# Patient Record
Sex: Female | Born: 1975
Health system: Southern US, Community
[De-identification: ages and names within clinical notes are randomized; demographics above are authoritative.]

---

## 1998-12-18 ENCOUNTER — Other Ambulatory Visit: Admission: RE | Admit: 1998-12-18 | Discharge: 1998-12-18 | Payer: Self-pay | Admitting: Family Medicine

## 1999-02-28 ENCOUNTER — Emergency Department (HOSPITAL_COMMUNITY): Admission: EM | Admit: 1999-02-28 | Discharge: 1999-02-28 | Payer: Self-pay | Admitting: Emergency Medicine

## 2000-06-09 ENCOUNTER — Other Ambulatory Visit: Admission: RE | Admit: 2000-06-09 | Discharge: 2000-06-09 | Payer: Self-pay | Admitting: Obstetrics & Gynecology

## 2001-06-02 ENCOUNTER — Other Ambulatory Visit: Admission: RE | Admit: 2001-06-02 | Discharge: 2001-06-02 | Payer: Self-pay | Admitting: Obstetrics and Gynecology

## 2002-06-19 ENCOUNTER — Inpatient Hospital Stay (HOSPITAL_COMMUNITY): Admission: AD | Admit: 2002-06-19 | Discharge: 2002-06-21 | Payer: Self-pay | Admitting: Obstetrics and Gynecology

## 2002-08-04 ENCOUNTER — Other Ambulatory Visit: Admission: RE | Admit: 2002-08-04 | Discharge: 2002-08-04 | Payer: Self-pay | Admitting: Obstetrics and Gynecology

## 2002-11-16 ENCOUNTER — Emergency Department (HOSPITAL_COMMUNITY): Admission: EM | Admit: 2002-11-16 | Discharge: 2002-11-16 | Payer: Self-pay | Admitting: Emergency Medicine

## 2003-08-09 ENCOUNTER — Other Ambulatory Visit: Admission: RE | Admit: 2003-08-09 | Discharge: 2003-08-09 | Payer: Self-pay | Admitting: Obstetrics and Gynecology

## 2004-05-02 ENCOUNTER — Emergency Department (HOSPITAL_COMMUNITY): Admission: EM | Admit: 2004-05-02 | Discharge: 2004-05-02 | Payer: Self-pay | Admitting: Emergency Medicine

## 2004-07-26 ENCOUNTER — Other Ambulatory Visit: Admission: RE | Admit: 2004-07-26 | Discharge: 2004-07-26 | Payer: Self-pay | Admitting: Obstetrics and Gynecology

## 2005-10-07 ENCOUNTER — Other Ambulatory Visit: Admission: RE | Admit: 2005-10-07 | Discharge: 2005-10-07 | Payer: Self-pay | Admitting: Obstetrics and Gynecology

## 2005-10-22 ENCOUNTER — Encounter: Admission: RE | Admit: 2005-10-22 | Discharge: 2005-10-22 | Payer: Self-pay | Admitting: Obstetrics and Gynecology

## 2015-03-21 ENCOUNTER — Other Ambulatory Visit: Payer: Self-pay

## 2015-03-21 DIAGNOSIS — Z1231 Encounter for screening mammogram for malignant neoplasm of breast: Secondary | ICD-10-CM

## 2015-04-03 ENCOUNTER — Ambulatory Visit
Admission: RE | Admit: 2015-04-03 | Discharge: 2015-04-03 | Disposition: A | Discharge status: Home / Self Care | Payer: BLUE CROSS/BLUE SHIELD | Source: Ambulatory Visit

## 2015-04-03 DIAGNOSIS — Z1231 Encounter for screening mammogram for malignant neoplasm of breast: Secondary | ICD-10-CM

## 2015-11-22 ENCOUNTER — Other Ambulatory Visit: Payer: Self-pay | Admitting: Orthopaedic Surgery

## 2015-11-22 DIAGNOSIS — M79651 Pain in right thigh: Secondary | ICD-10-CM

## 2015-12-02 ENCOUNTER — Ambulatory Visit
Admission: RE | Admit: 2015-12-02 | Discharge: 2015-12-02 | Disposition: A | Discharge status: Home / Self Care | Payer: BLUE CROSS/BLUE SHIELD | Source: Ambulatory Visit | Attending: Orthopaedic Surgery | Admitting: Orthopaedic Surgery

## 2015-12-02 DIAGNOSIS — M79651 Pain in right thigh: Secondary | ICD-10-CM

## 2016-01-04 DIAGNOSIS — F341 Dysthymic disorder: Secondary | ICD-10-CM | POA: Diagnosis not present

## 2016-01-13 DIAGNOSIS — F341 Dysthymic disorder: Secondary | ICD-10-CM | POA: Diagnosis not present

## 2016-01-31 DIAGNOSIS — F341 Dysthymic disorder: Secondary | ICD-10-CM | POA: Diagnosis not present

## 2016-02-02 DIAGNOSIS — Z713 Dietary counseling and surveillance: Secondary | ICD-10-CM | POA: Diagnosis not present

## 2016-02-22 DIAGNOSIS — F341 Dysthymic disorder: Secondary | ICD-10-CM | POA: Diagnosis not present

## 2016-03-14 DIAGNOSIS — F341 Dysthymic disorder: Secondary | ICD-10-CM | POA: Diagnosis not present

## 2016-04-09 DIAGNOSIS — Z01419 Encounter for gynecological examination (general) (routine) without abnormal findings: Secondary | ICD-10-CM | POA: Diagnosis not present

## 2016-04-10 DIAGNOSIS — Z713 Dietary counseling and surveillance: Secondary | ICD-10-CM | POA: Diagnosis not present

## 2016-04-18 ENCOUNTER — Other Ambulatory Visit: Payer: Self-pay | Admitting: Obstetrics and Gynecology

## 2016-04-18 DIAGNOSIS — Z1231 Encounter for screening mammogram for malignant neoplasm of breast: Secondary | ICD-10-CM

## 2016-04-22 DIAGNOSIS — F341 Dysthymic disorder: Secondary | ICD-10-CM | POA: Diagnosis not present

## 2016-05-06 ENCOUNTER — Ambulatory Visit
Admission: RE | Admit: 2016-05-06 | Discharge: 2016-05-06 | Disposition: A | Discharge status: Home / Self Care | Payer: BLUE CROSS/BLUE SHIELD | Source: Ambulatory Visit | Attending: Obstetrics and Gynecology | Admitting: Obstetrics and Gynecology

## 2016-05-06 DIAGNOSIS — Z1231 Encounter for screening mammogram for malignant neoplasm of breast: Secondary | ICD-10-CM | POA: Diagnosis not present

## 2016-05-17 DIAGNOSIS — E538 Deficiency of other specified B group vitamins: Secondary | ICD-10-CM | POA: Diagnosis not present

## 2016-05-28 DIAGNOSIS — L738 Other specified follicular disorders: Secondary | ICD-10-CM | POA: Diagnosis not present

## 2016-05-28 DIAGNOSIS — D2239 Melanocytic nevi of other parts of face: Secondary | ICD-10-CM | POA: Diagnosis not present

## 2016-05-28 DIAGNOSIS — L720 Epidermal cyst: Secondary | ICD-10-CM | POA: Diagnosis not present

## 2016-05-31 DIAGNOSIS — E538 Deficiency of other specified B group vitamins: Secondary | ICD-10-CM | POA: Diagnosis not present

## 2016-06-06 DIAGNOSIS — F341 Dysthymic disorder: Secondary | ICD-10-CM | POA: Diagnosis not present

## 2016-06-19 DIAGNOSIS — Z713 Dietary counseling and surveillance: Secondary | ICD-10-CM | POA: Diagnosis not present

## 2016-06-28 DIAGNOSIS — E538 Deficiency of other specified B group vitamins: Secondary | ICD-10-CM | POA: Diagnosis not present

## 2016-07-22 DIAGNOSIS — F341 Dysthymic disorder: Secondary | ICD-10-CM | POA: Diagnosis not present

## 2016-07-26 DIAGNOSIS — E538 Deficiency of other specified B group vitamins: Secondary | ICD-10-CM | POA: Diagnosis not present

## 2016-07-31 ENCOUNTER — Ambulatory Visit (INDEPENDENT_AMBULATORY_CARE_PROVIDER_SITE_OTHER): Payer: Self-pay | Admitting: Orthopaedic Surgery

## 2016-08-13 DIAGNOSIS — Z713 Dietary counseling and surveillance: Secondary | ICD-10-CM | POA: Diagnosis not present

## 2016-08-23 DIAGNOSIS — Z23 Encounter for immunization: Secondary | ICD-10-CM | POA: Diagnosis not present

## 2016-08-23 DIAGNOSIS — E538 Deficiency of other specified B group vitamins: Secondary | ICD-10-CM | POA: Diagnosis not present

## 2016-09-19 DIAGNOSIS — E538 Deficiency of other specified B group vitamins: Secondary | ICD-10-CM | POA: Diagnosis not present

## 2016-10-22 DIAGNOSIS — Z713 Dietary counseling and surveillance: Secondary | ICD-10-CM | POA: Diagnosis not present

## 2016-11-13 DIAGNOSIS — F3342 Major depressive disorder, recurrent, in full remission: Secondary | ICD-10-CM | POA: Diagnosis not present

## 2016-11-13 DIAGNOSIS — E538 Deficiency of other specified B group vitamins: Secondary | ICD-10-CM | POA: Diagnosis not present

## 2016-12-19 DIAGNOSIS — Z713 Dietary counseling and surveillance: Secondary | ICD-10-CM | POA: Diagnosis not present

## 2017-01-11 IMAGING — MG 2D DIGITAL SCREENING BILATERAL MAMMOGRAM WITH CAD AND ADJUNCT TO
9 of 12 series · 9 of 28 positions shown · non-contrast
Comparison: Previous exam(s).

CLINICAL DATA: Screening.

EXAM:
2D DIGITAL SCREENING BILATERAL MAMMOGRAM WITH CAD AND ADJUNCT TOMO

[R MLO]
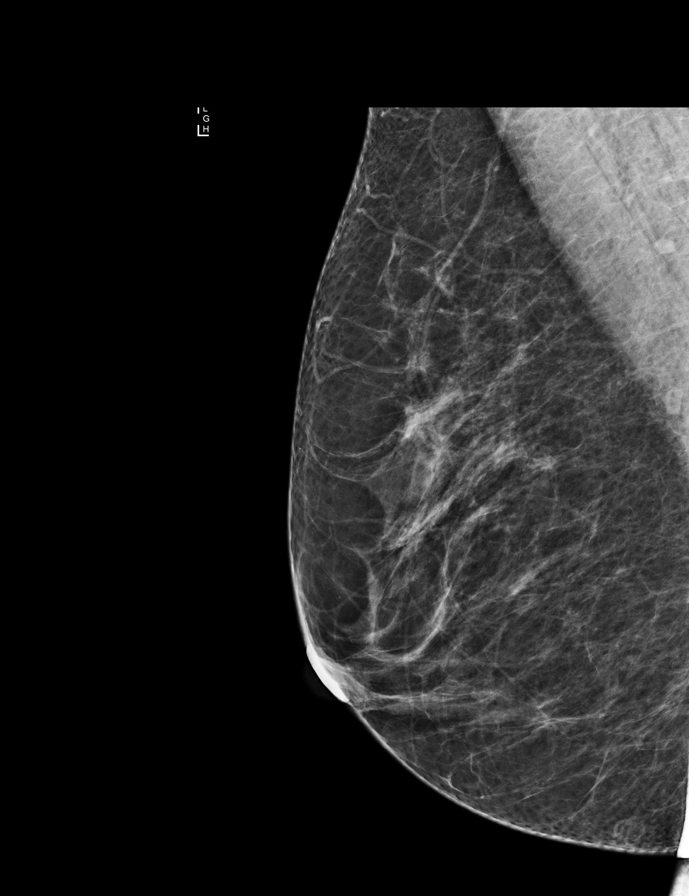

[L CC]
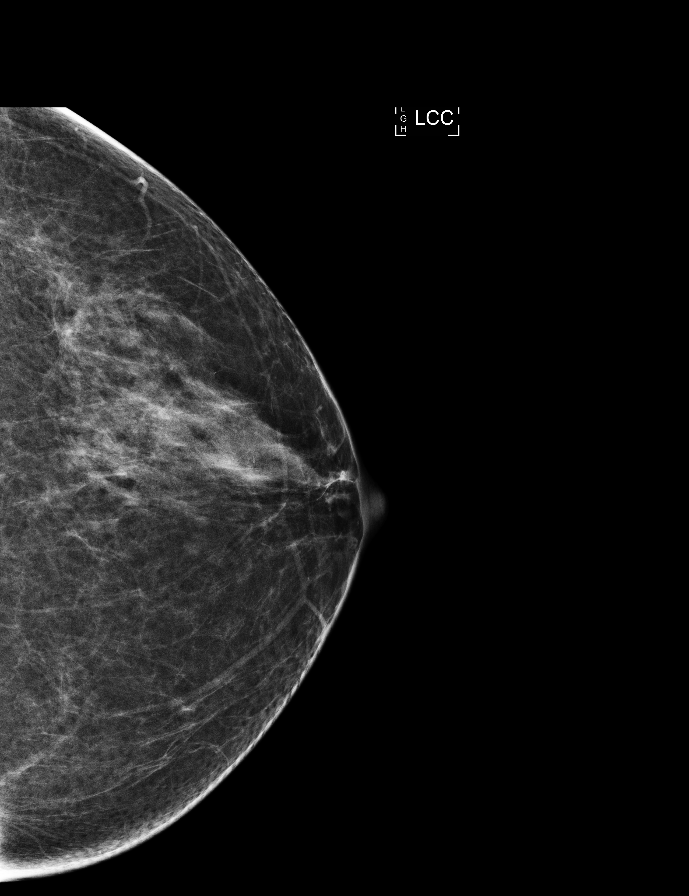

[L MLO]
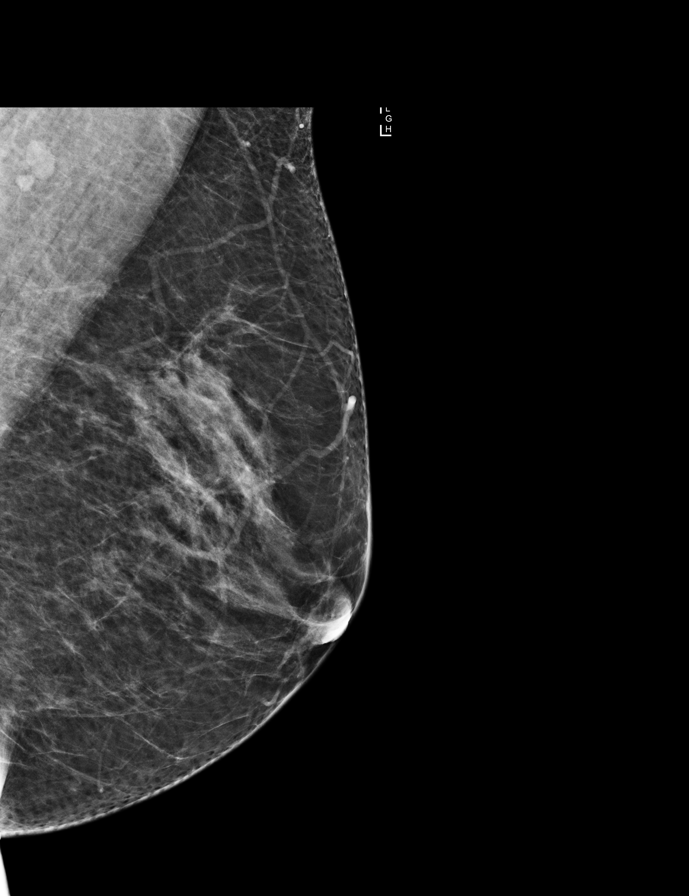

[R CC synth-2D]
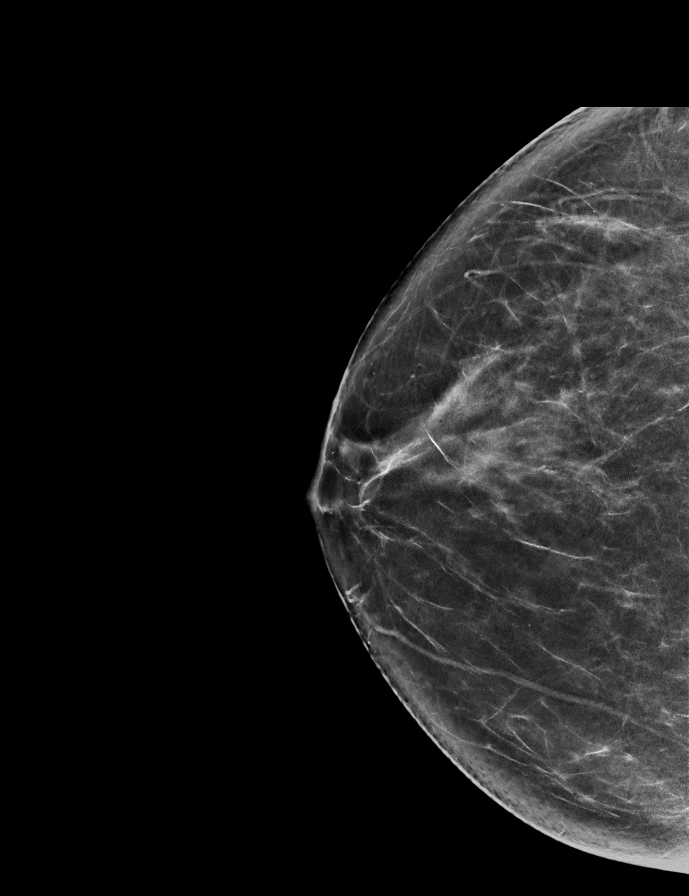

[L CC synth-2D]
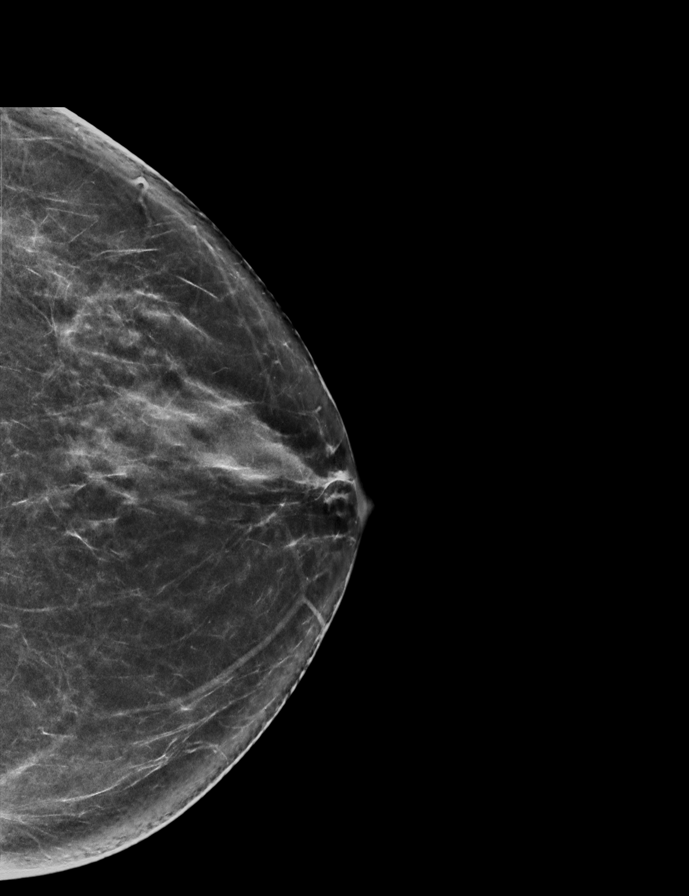

[R MLO synth-2D]
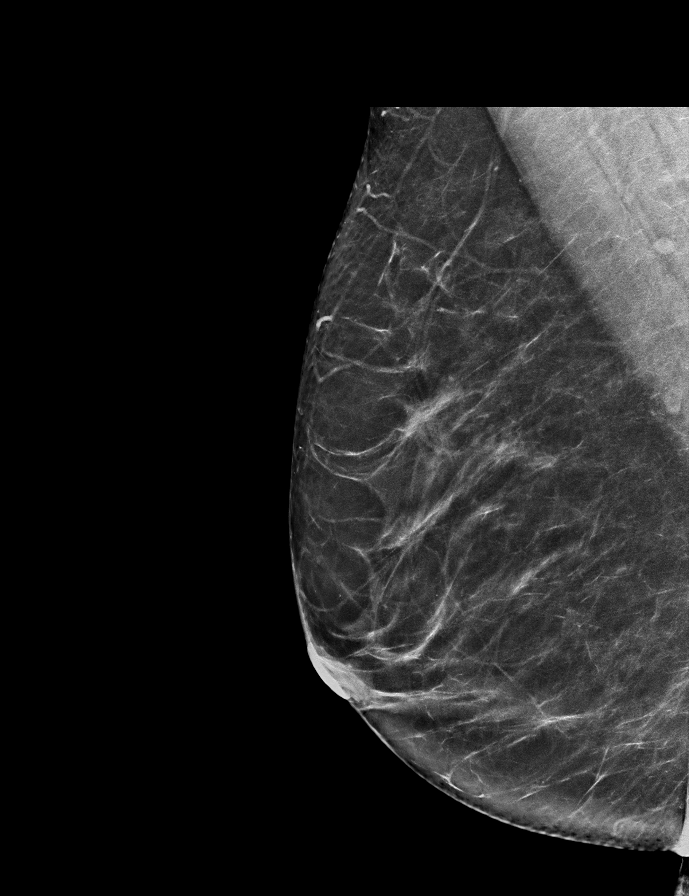

[L MLO synth-2D]
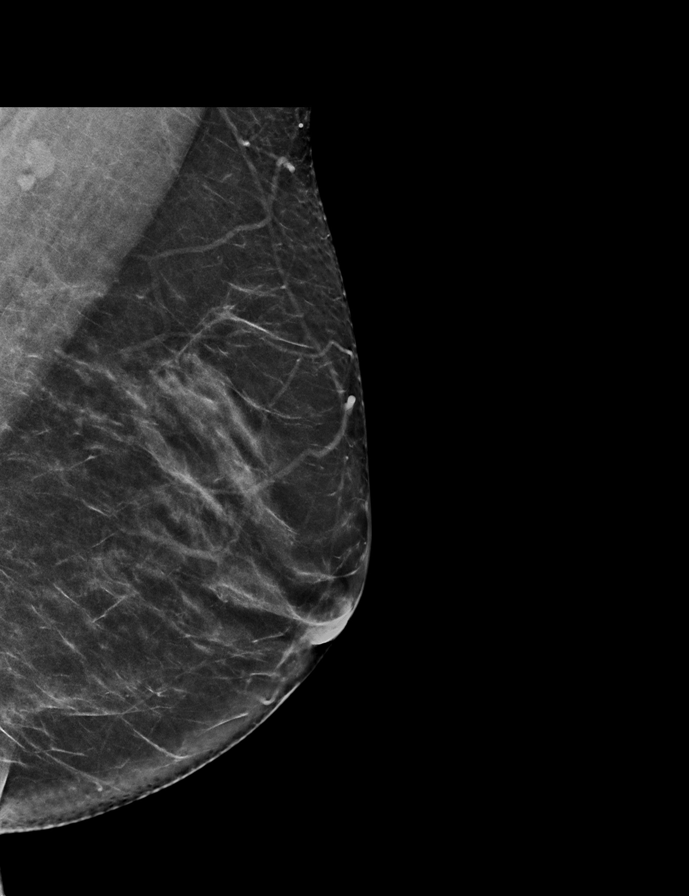

[R CC]
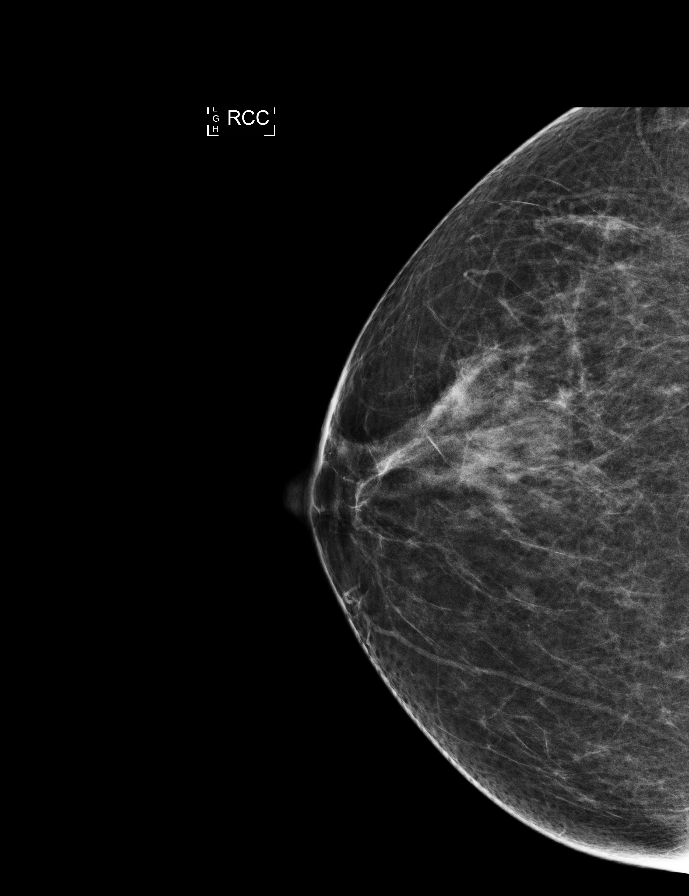

[R CC tomo · tomo slice 37/72.0]
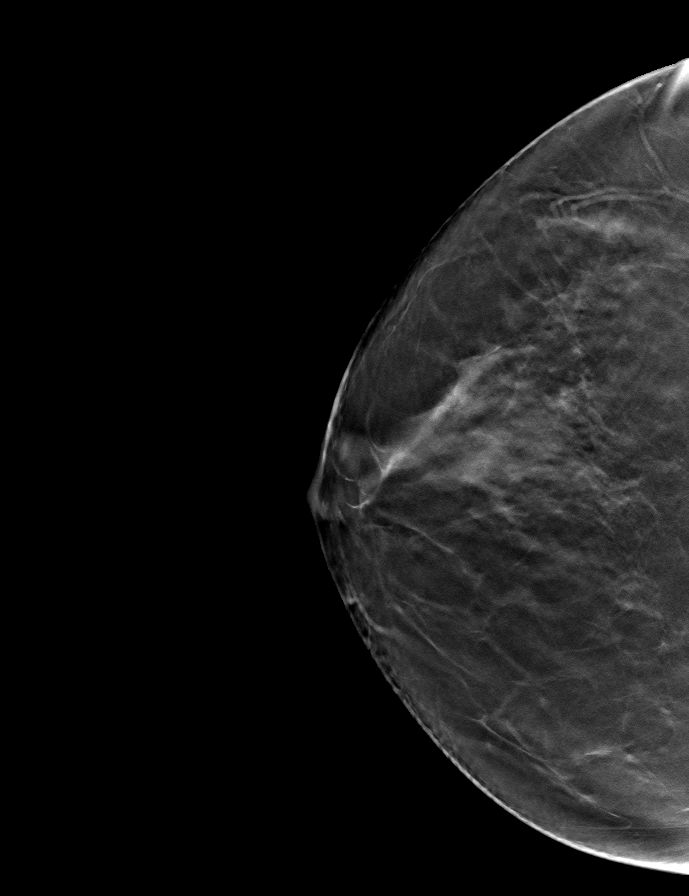

[9 of 28 positions shown; findings below may reference images not displayed]

ACR Breast Density Category b: There are scattered areas of
fibroglandular density.
FINDINGS: There are no findings suspicious for malignancy. Images were
processed with CAD.
IMPRESSION: No mammographic evidence of malignancy. A result letter of this
screening mammogram will be mailed directly to the patient.

RECOMMENDATION:
Screening mammogram in one year. (Code:97-6-RS4)

BI-RADS CATEGORY  1: Negative.

## 2017-02-20 DIAGNOSIS — Z713 Dietary counseling and surveillance: Secondary | ICD-10-CM | POA: Diagnosis not present

## 2017-04-02 DIAGNOSIS — D2272 Melanocytic nevi of left lower limb, including hip: Secondary | ICD-10-CM | POA: Diagnosis not present

## 2017-04-02 DIAGNOSIS — D225 Melanocytic nevi of trunk: Secondary | ICD-10-CM | POA: Diagnosis not present

## 2017-04-02 DIAGNOSIS — D2230 Melanocytic nevi of unspecified part of face: Secondary | ICD-10-CM | POA: Diagnosis not present

## 2017-04-09 ENCOUNTER — Other Ambulatory Visit: Payer: Self-pay | Admitting: Obstetrics and Gynecology

## 2017-04-09 DIAGNOSIS — Z1231 Encounter for screening mammogram for malignant neoplasm of breast: Secondary | ICD-10-CM

## 2017-04-10 DIAGNOSIS — Z01419 Encounter for gynecological examination (general) (routine) without abnormal findings: Secondary | ICD-10-CM | POA: Diagnosis not present

## 2017-04-10 DIAGNOSIS — N898 Other specified noninflammatory disorders of vagina: Secondary | ICD-10-CM | POA: Diagnosis not present

## 2017-04-24 DIAGNOSIS — Z713 Dietary counseling and surveillance: Secondary | ICD-10-CM | POA: Diagnosis not present

## 2017-05-13 ENCOUNTER — Ambulatory Visit
Admission: RE | Admit: 2017-05-13 | Discharge: 2017-05-13 | Disposition: A | Discharge status: Home / Self Care | Payer: BLUE CROSS/BLUE SHIELD | Source: Ambulatory Visit | Attending: Obstetrics and Gynecology | Admitting: Obstetrics and Gynecology

## 2017-05-13 DIAGNOSIS — Z1231 Encounter for screening mammogram for malignant neoplasm of breast: Secondary | ICD-10-CM

## 2017-05-20 DIAGNOSIS — F3342 Major depressive disorder, recurrent, in full remission: Secondary | ICD-10-CM | POA: Diagnosis not present

## 2017-07-02 DIAGNOSIS — Z713 Dietary counseling and surveillance: Secondary | ICD-10-CM | POA: Diagnosis not present

## 2017-07-22 DIAGNOSIS — Z23 Encounter for immunization: Secondary | ICD-10-CM | POA: Diagnosis not present

## 2017-08-26 DIAGNOSIS — Z713 Dietary counseling and surveillance: Secondary | ICD-10-CM | POA: Diagnosis not present

## 2017-09-02 DIAGNOSIS — D2239 Melanocytic nevi of other parts of face: Secondary | ICD-10-CM | POA: Diagnosis not present

## 2017-09-02 DIAGNOSIS — L720 Epidermal cyst: Secondary | ICD-10-CM | POA: Diagnosis not present

## 2017-09-02 DIAGNOSIS — L700 Acne vulgaris: Secondary | ICD-10-CM | POA: Diagnosis not present

## 2017-10-30 DIAGNOSIS — Z713 Dietary counseling and surveillance: Secondary | ICD-10-CM | POA: Diagnosis not present

## 2017-11-19 DIAGNOSIS — Z13220 Encounter for screening for lipoid disorders: Secondary | ICD-10-CM | POA: Diagnosis not present

## 2017-11-19 DIAGNOSIS — Z0000 Encounter for general adult medical examination without abnormal findings: Secondary | ICD-10-CM | POA: Diagnosis not present

## 2017-11-19 DIAGNOSIS — E538 Deficiency of other specified B group vitamins: Secondary | ICD-10-CM | POA: Diagnosis not present

## 2017-11-19 DIAGNOSIS — F3342 Major depressive disorder, recurrent, in full remission: Secondary | ICD-10-CM | POA: Diagnosis not present

## 2017-12-24 DIAGNOSIS — J019 Acute sinusitis, unspecified: Secondary | ICD-10-CM | POA: Diagnosis not present

## 2017-12-25 DIAGNOSIS — Z713 Dietary counseling and surveillance: Secondary | ICD-10-CM | POA: Diagnosis not present

## 2018-01-05 DIAGNOSIS — J069 Acute upper respiratory infection, unspecified: Secondary | ICD-10-CM | POA: Diagnosis not present

## 2018-03-05 DIAGNOSIS — Z713 Dietary counseling and surveillance: Secondary | ICD-10-CM | POA: Diagnosis not present

## 2018-06-08 ENCOUNTER — Other Ambulatory Visit (HOSPITAL_COMMUNITY)
Admission: RE | Admit: 2018-06-08 | Discharge: 2018-06-08 | Disposition: A | Discharge status: Home / Self Care | Payer: Managed Care, Other (non HMO) | Source: Ambulatory Visit | Attending: Obstetrics and Gynecology | Admitting: Obstetrics and Gynecology

## 2018-06-08 ENCOUNTER — Other Ambulatory Visit: Payer: Self-pay | Admitting: Obstetrics and Gynecology

## 2018-06-08 DIAGNOSIS — Z01411 Encounter for gynecological examination (general) (routine) with abnormal findings: Secondary | ICD-10-CM | POA: Insufficient documentation

## 2018-06-10 LAB — CYTOLOGY - PAP
Diagnosis: NEGATIVE
HPV: NOT DETECTED

## 2018-06-29 ENCOUNTER — Other Ambulatory Visit: Payer: Self-pay | Admitting: Obstetrics and Gynecology

## 2018-06-29 DIAGNOSIS — Z1231 Encounter for screening mammogram for malignant neoplasm of breast: Secondary | ICD-10-CM

## 2018-08-04 ENCOUNTER — Ambulatory Visit
Admission: RE | Admit: 2018-08-04 | Discharge: 2018-08-04 | Disposition: A | Discharge status: Home / Self Care | Payer: Managed Care, Other (non HMO) | Source: Ambulatory Visit | Attending: Obstetrics and Gynecology | Admitting: Obstetrics and Gynecology

## 2018-08-04 DIAGNOSIS — Z1231 Encounter for screening mammogram for malignant neoplasm of breast: Secondary | ICD-10-CM

## 2018-11-24 DIAGNOSIS — E538 Deficiency of other specified B group vitamins: Secondary | ICD-10-CM | POA: Diagnosis not present

## 2018-11-24 DIAGNOSIS — Z0000 Encounter for general adult medical examination without abnormal findings: Secondary | ICD-10-CM | POA: Diagnosis not present

## 2018-11-24 DIAGNOSIS — Z131 Encounter for screening for diabetes mellitus: Secondary | ICD-10-CM | POA: Diagnosis not present

## 2018-11-24 DIAGNOSIS — E7800 Pure hypercholesterolemia, unspecified: Secondary | ICD-10-CM | POA: Diagnosis not present

## 2019-02-03 DIAGNOSIS — F3342 Major depressive disorder, recurrent, in full remission: Secondary | ICD-10-CM | POA: Diagnosis not present

## 2019-02-03 DIAGNOSIS — F419 Anxiety disorder, unspecified: Secondary | ICD-10-CM | POA: Diagnosis not present

## 2019-04-30 DIAGNOSIS — D2230 Melanocytic nevi of unspecified part of face: Secondary | ICD-10-CM | POA: Diagnosis not present

## 2019-04-30 DIAGNOSIS — D485 Neoplasm of uncertain behavior of skin: Secondary | ICD-10-CM | POA: Diagnosis not present

## 2019-04-30 DIAGNOSIS — D225 Melanocytic nevi of trunk: Secondary | ICD-10-CM | POA: Diagnosis not present

## 2019-04-30 DIAGNOSIS — D2262 Melanocytic nevi of left upper limb, including shoulder: Secondary | ICD-10-CM | POA: Diagnosis not present

## 2019-06-25 DIAGNOSIS — Z23 Encounter for immunization: Secondary | ICD-10-CM | POA: Diagnosis not present

## 2019-07-13 DIAGNOSIS — Z20828 Contact with and (suspected) exposure to other viral communicable diseases: Secondary | ICD-10-CM | POA: Diagnosis not present

## 2019-07-15 ENCOUNTER — Other Ambulatory Visit: Payer: Self-pay | Admitting: Obstetrics and Gynecology

## 2019-07-15 DIAGNOSIS — Z1231 Encounter for screening mammogram for malignant neoplasm of breast: Secondary | ICD-10-CM

## 2019-08-06 DIAGNOSIS — F3342 Major depressive disorder, recurrent, in full remission: Secondary | ICD-10-CM | POA: Diagnosis not present

## 2019-08-06 DIAGNOSIS — F419 Anxiety disorder, unspecified: Secondary | ICD-10-CM | POA: Diagnosis not present

## 2019-09-01 DIAGNOSIS — Z01419 Encounter for gynecological examination (general) (routine) without abnormal findings: Secondary | ICD-10-CM | POA: Diagnosis not present

## 2019-09-02 ENCOUNTER — Ambulatory Visit
Admission: RE | Admit: 2019-09-02 | Discharge: 2019-09-02 | Disposition: A | Discharge status: Home / Self Care | Payer: BLUE CROSS/BLUE SHIELD | Source: Ambulatory Visit | Attending: Obstetrics and Gynecology | Admitting: Obstetrics and Gynecology

## 2019-09-02 ENCOUNTER — Other Ambulatory Visit: Payer: Self-pay

## 2019-09-02 DIAGNOSIS — Z1231 Encounter for screening mammogram for malignant neoplasm of breast: Secondary | ICD-10-CM | POA: Diagnosis not present

## 2019-09-02 DIAGNOSIS — M67432 Ganglion, left wrist: Secondary | ICD-10-CM | POA: Diagnosis not present

## 2019-11-08 DIAGNOSIS — Z713 Dietary counseling and surveillance: Secondary | ICD-10-CM | POA: Diagnosis not present

## 2019-11-25 DIAGNOSIS — Z0000 Encounter for general adult medical examination without abnormal findings: Secondary | ICD-10-CM | POA: Diagnosis not present

## 2019-11-25 DIAGNOSIS — Z131 Encounter for screening for diabetes mellitus: Secondary | ICD-10-CM | POA: Diagnosis not present

## 2019-11-25 DIAGNOSIS — E7800 Pure hypercholesterolemia, unspecified: Secondary | ICD-10-CM | POA: Diagnosis not present

## 2020-03-28 DIAGNOSIS — M79672 Pain in left foot: Secondary | ICD-10-CM | POA: Diagnosis not present

## 2020-05-15 ENCOUNTER — Ambulatory Visit: Payer: Managed Care, Other (non HMO) | Admitting: Orthopaedic Surgery

## 2020-05-16 DIAGNOSIS — L300 Nummular dermatitis: Secondary | ICD-10-CM | POA: Diagnosis not present

## 2020-05-16 DIAGNOSIS — L700 Acne vulgaris: Secondary | ICD-10-CM | POA: Diagnosis not present

## 2020-05-16 DIAGNOSIS — D225 Melanocytic nevi of trunk: Secondary | ICD-10-CM | POA: Diagnosis not present

## 2020-05-16 DIAGNOSIS — L738 Other specified follicular disorders: Secondary | ICD-10-CM | POA: Diagnosis not present

## 2020-06-06 ENCOUNTER — Ambulatory Visit: Payer: Managed Care, Other (non HMO)

## 2020-06-06 ENCOUNTER — Ambulatory Visit: Payer: BLUE CROSS/BLUE SHIELD | Admitting: Orthopaedic Surgery

## 2020-06-06 DIAGNOSIS — M79672 Pain in left foot: Secondary | ICD-10-CM | POA: Diagnosis not present

## 2020-06-06 MED ORDER — LIDOCAINE HCL 1 % IJ SOLN
0.5000 mL | INTRAMUSCULAR | Status: AC | PRN
  Administered 2020-06-06: .5 mL

## 2020-06-06 MED ORDER — METHYLPREDNISOLONE ACETATE 40 MG/ML IJ SUSP
40.0000 mg | INTRAMUSCULAR | Status: AC | PRN
  Administered 2020-06-06: 40 mg

## 2020-06-06 NOTE — Progress Notes (Signed)
Office Visit Note   Patient: Kayla Jordan           Date of Birth: 1976-01-25           MRN: [id number] Visit Date: 06/06/2020              Requested by: No referring provider defined for this encounter. PCP: Patient, No Pcp Per   Assessment & Plan: Visit Diagnoses:  1. Pain in left foot     Plan: I have recommended the patient try some Voltaren gel on this aspect of her foot twice a day.  Also recommend a steroid injection over the painful area which she tolerated well.  I would like to send her to [facility] to look at the shoes that offer her much more significant support for feet.  I explained the rationale behind this.  All question concerns were answered or addressed.  I would like to see her back in 6 weeks.  I would consider repeat injection there if needed.  Follow-Up Instructions: Return in about 6 weeks (around 07/18/2020).   Orders:  Orders Placed This Encounter  Procedures   Foot Inj   XR Foot Complete Left   No orders of the defined types were placed in this encounter.     Procedures: Foot Inj  Date/Time: 06/06/2020 9:24 AM Performed by: Kathryne Hitch, MD Authorized by: Kathryne Hitch, MD   Indications:  Pain Condition: Plantar Fasciitis   Condition comment:  Medial foot pain Location: left plantar fascia muscle   Medications:  0.5 mL lidocaine 1 %; 40 mg methylPREDNISolone acetate 40 MG/ML     Clinical Data: No additional findings.   Subjective: Chief Complaint  Patient presents with   Left Foot - Pain  Patient is a very pleasant 44 year old active female who comes in with a history of 4 months of worsening left foot pain.  She points to the medial aspect of her foot as a source of her pain.  It does not occur on the bottom of the foot.  This started after doing yoga exercises.  She says this first painful when she gets up in the morning and if she is walks a lot.  She does work from home and wear socks mostly, she is at  home working.  Her tennis shoes are very flexible and do not offer support just on my observation of the tennis shoes.  She is never injured her foot or ankle before.  She is not a diabetic and not a smoker.  She denies any injury specifically that she is aware of.  She denies any numbness and tingling in her foot.  HPI  Review of Systems There is currently listed no chest pain, shortness of breath, fever, chills, nausea, vomiting  Objective: Vital Signs: There were no vitals taken for this visit.  Physical Exam She is alert and orient x3 and in no acute distress Ortho Exam Examination of her left foot she has pain over the medial aspect of the foot at the midfoot near the navicular area.  The remainder of her foot exam is entirely normal.  She has full range of motion of her foot.  Is neurovascular intact.  Her Achilles is intact and not painful. Specialty Comments:  No specialty comments available.  Imaging: XR Foot Complete Left  Result Date: 06/06/2020 3 views of the left foot show no acute findings.  There is a cortical irregularity near the navicular consistent with an accessory navicular.  There is also cortical irregularity near the cuboid on the lateral aspect of the foot.  The joint spaces are well-maintained and the arch is normal..    PMFS History: There are no problems to display for this patient.  No past medical history on file.  Family History  Problem Relation Age of Onset   Breast cancer Cousin     No past surgical history on file. Social History   Occupational History   Not on file  Tobacco Use   Smoking status: Not on file  Substance and Sexual Activity   Alcohol use: Not on file   Drug use: Not on file   Sexual activity: Not on file

## 2020-06-14 DIAGNOSIS — L929 Granulomatous disorder of the skin and subcutaneous tissue, unspecified: Secondary | ICD-10-CM | POA: Diagnosis not present

## 2020-06-14 DIAGNOSIS — L719 Rosacea, unspecified: Secondary | ICD-10-CM | POA: Diagnosis not present

## 2020-06-14 DIAGNOSIS — D485 Neoplasm of uncertain behavior of skin: Secondary | ICD-10-CM | POA: Diagnosis not present

## 2020-07-18 ENCOUNTER — Ambulatory Visit: Payer: Managed Care, Other (non HMO) | Admitting: Orthopaedic Surgery

## 2020-07-19 ENCOUNTER — Ambulatory Visit: Payer: Managed Care, Other (non HMO) | Admitting: Orthopaedic Surgery

## 2020-09-12 ENCOUNTER — Other Ambulatory Visit: Payer: Self-pay | Admitting: Obstetrics and Gynecology

## 2020-09-12 DIAGNOSIS — Z1231 Encounter for screening mammogram for malignant neoplasm of breast: Secondary | ICD-10-CM

## 2020-10-23 ENCOUNTER — Ambulatory Visit: Payer: Managed Care, Other (non HMO)

## 2020-10-31 ENCOUNTER — Ambulatory Visit: Payer: Managed Care, Other (non HMO)

## 2020-10-31 DIAGNOSIS — F325 Major depressive disorder, single episode, in full remission: Secondary | ICD-10-CM | POA: Diagnosis not present

## 2020-10-31 DIAGNOSIS — Z23 Encounter for immunization: Secondary | ICD-10-CM | POA: Diagnosis not present

## 2020-10-31 DIAGNOSIS — Z79899 Other long term (current) drug therapy: Secondary | ICD-10-CM | POA: Diagnosis not present

## 2020-10-31 DIAGNOSIS — E538 Deficiency of other specified B group vitamins: Secondary | ICD-10-CM | POA: Diagnosis not present

## 2020-10-31 DIAGNOSIS — Z0000 Encounter for general adult medical examination without abnormal findings: Secondary | ICD-10-CM | POA: Diagnosis not present

## 2020-10-31 DIAGNOSIS — F5000 Anorexia nervosa, unspecified: Secondary | ICD-10-CM | POA: Diagnosis not present

## 2020-10-31 DIAGNOSIS — Z13220 Encounter for screening for lipoid disorders: Secondary | ICD-10-CM | POA: Diagnosis not present

## 2020-12-05 ENCOUNTER — Ambulatory Visit: Payer: Managed Care, Other (non HMO)

## 2020-12-11 ENCOUNTER — Ambulatory Visit: Payer: Managed Care, Other (non HMO)

## 2020-12-19 DIAGNOSIS — Z1231 Encounter for screening mammogram for malignant neoplasm of breast: Secondary | ICD-10-CM | POA: Diagnosis not present

## 2020-12-25 DIAGNOSIS — M545 Low back pain, unspecified: Secondary | ICD-10-CM | POA: Diagnosis not present

## 2020-12-25 DIAGNOSIS — Z124 Encounter for screening for malignant neoplasm of cervix: Secondary | ICD-10-CM | POA: Diagnosis not present

## 2021-03-14 DIAGNOSIS — J0100 Acute maxillary sinusitis, unspecified: Secondary | ICD-10-CM | POA: Diagnosis not present

## 2021-03-14 DIAGNOSIS — U071 COVID-19: Secondary | ICD-10-CM | POA: Diagnosis not present

## 2021-03-28 DIAGNOSIS — M9901 Segmental and somatic dysfunction of cervical region: Secondary | ICD-10-CM | POA: Diagnosis not present

## 2021-05-23 DIAGNOSIS — L821 Other seborrheic keratosis: Secondary | ICD-10-CM | POA: Diagnosis not present

## 2021-05-23 DIAGNOSIS — D225 Melanocytic nevi of trunk: Secondary | ICD-10-CM | POA: Diagnosis not present

## 2021-05-23 DIAGNOSIS — L578 Other skin changes due to chronic exposure to nonionizing radiation: Secondary | ICD-10-CM | POA: Diagnosis not present

## 2021-05-23 DIAGNOSIS — L738 Other specified follicular disorders: Secondary | ICD-10-CM | POA: Diagnosis not present

## 2021-05-29 DIAGNOSIS — D120 Benign neoplasm of cecum: Secondary | ICD-10-CM | POA: Diagnosis not present

## 2021-05-29 DIAGNOSIS — Z1211 Encounter for screening for malignant neoplasm of colon: Secondary | ICD-10-CM | POA: Diagnosis not present

## 2021-05-29 DIAGNOSIS — D123 Benign neoplasm of transverse colon: Secondary | ICD-10-CM | POA: Diagnosis not present

## 2021-06-20 DIAGNOSIS — Z23 Encounter for immunization: Secondary | ICD-10-CM | POA: Diagnosis not present

## 2022-07-25 ENCOUNTER — Ambulatory Visit: Payer: Managed Care, Other (non HMO) | Admitting: Orthopaedic Surgery

## 2022-08-29 ENCOUNTER — Other Ambulatory Visit: Payer: Self-pay | Admitting: Student

## 2022-08-29 DIAGNOSIS — R413 Other amnesia: Secondary | ICD-10-CM

## 2022-08-30 ENCOUNTER — Encounter: Payer: Self-pay | Admitting: Student

## 2022-09-03 ENCOUNTER — Ambulatory Visit
Admission: RE | Admit: 2022-09-03 | Discharge: 2022-09-03 | Disposition: A | Discharge status: Home / Self Care | Payer: Managed Care, Other (non HMO) | Source: Ambulatory Visit | Attending: Student

## 2022-09-03 ENCOUNTER — Other Ambulatory Visit: Payer: Self-pay | Admitting: Student

## 2022-09-03 DIAGNOSIS — R413 Other amnesia: Secondary | ICD-10-CM

## 2022-09-04 ENCOUNTER — Ambulatory Visit
Admission: RE | Admit: 2022-09-04 | Discharge: 2022-09-04 | Disposition: A | Discharge status: Home / Self Care | Payer: 59 | Source: Ambulatory Visit | Attending: Student | Admitting: Student

## 2022-09-04 DIAGNOSIS — R413 Other amnesia: Secondary | ICD-10-CM

## 2023-11-10 ENCOUNTER — Other Ambulatory Visit (INDEPENDENT_AMBULATORY_CARE_PROVIDER_SITE_OTHER): Payer: Self-pay

## 2023-11-10 ENCOUNTER — Ambulatory Visit: Payer: 59 | Admitting: Orthopaedic Surgery

## 2023-11-10 DIAGNOSIS — M25512 Pain in left shoulder: Secondary | ICD-10-CM | POA: Diagnosis not present

## 2023-11-10 DIAGNOSIS — M7711 Lateral epicondylitis, right elbow: Secondary | ICD-10-CM | POA: Diagnosis not present

## 2023-11-10 DIAGNOSIS — M7542 Impingement syndrome of left shoulder: Secondary | ICD-10-CM

## 2023-11-10 DIAGNOSIS — G8929 Other chronic pain: Secondary | ICD-10-CM | POA: Diagnosis not present

## 2023-11-10 MED ORDER — METHYLPREDNISOLONE ACETATE 40 MG/ML IJ SUSP
40.0000 mg | INTRAMUSCULAR | Status: AC | PRN
  Administered 2023-11-10: 40 mg via INTRA_ARTICULAR

## 2023-11-10 MED ORDER — LIDOCAINE HCL 1 % IJ SOLN
3.0000 mL | INTRAMUSCULAR | Status: AC | PRN
  Administered 2023-11-10: 3 mL

## 2023-11-10 MED ORDER — METHYLPREDNISOLONE ACETATE 40 MG/ML IJ SUSP
40.0000 mg | INTRAMUSCULAR | Status: AC | PRN
  Administered 2023-11-10: 40 mg

## 2023-11-10 MED ORDER — LIDOCAINE HCL 1 % IJ SOLN
1.0000 mL | INTRAMUSCULAR | Status: AC | PRN
  Administered 2023-11-10: 1 mL

## 2023-11-10 NOTE — Progress Notes (Signed)
 The patient is a 48 year old who comes in today for evaluation treatment of left shoulder pain and right elbow pain.  She works in a computer all day long but she has been exercising quite a bit as well.  She does state that she was using more machine for bench pressing but she is gone free weights recently.  She denies any specific injury.  She does lay on her left side at night and the shoulder does wake her up at night.  She is a somewhat tender to the touch as well as her right elbow.  She denies any numbness and tingling in her upper extremities and denies any neck pain.  She is an active 48 year old female.  On exam her left shoulder has full range of motion.  There are some signs of impingement but there is no weakness in the rotator cuff at all and there is a negative lift off.  Examination of her right elbow she has pain over the lateral epicondylar area of the elbow but otherwise normal elbow exam.  X-rays of the left shoulder are unremarkable with normal AC joint and normal glenohumeral joint.  The subacromial outlet looks normal.  She is dealing with left shoulder impingement and right tennis elbow.  I recommend an injection in both areas of steroid she agreed to this and tolerated well.  I wanted to modify her activities as a relates to weight lifting and I showed her some stretching exercises for the elbow.  She did tolerate steroid injections well in her left shoulder and her right elbow.  If things do not improve I would send her to outpatient physical therapy and would even consider a follow-up appoint with Dr. Shon Baton in the office here for considering soft tissue treatments to her right elbow.  She agrees with the treatment plan.     Procedure Note  Patient: Kayla Jordan             Date of Birth: 1975-11-22           MRN: [id number]             Visit Date: 11/10/2023  Procedures: Visit Diagnoses:  1. Chronic left shoulder pain   2. Impingement syndrome of left shoulder    3. Right lateral epicondylitis     Large Joint Inj: L subacromial bursa on 11/10/2023 10:45 AM Indications: pain and diagnostic evaluation Details: 22 G 1.5 in needle  Arthrogram: No  Medications: 3 mL lidocaine 1 %; 40 mg methylPREDNISolone acetate 40 MG/ML Outcome: tolerated well, no immediate complications Procedure, treatment alternatives, risks and benefits explained, specific risks discussed. Consent was given by the patient. Immediately prior to procedure a time out was called to verify the correct patient, procedure, equipment, support staff and site/side marked as required. Patient was prepped and draped in the usual sterile fashion.    Hand/UE Inj: R elbow for lateral epicondylitis on 11/10/2023 10:45 AM Medications: 1 mL lidocaine 1 %; 40 mg methylPREDNISolone acetate 40 MG/ML

## 2024-07-26 ENCOUNTER — Encounter: Payer: Self-pay | Admitting: Radiology
